# Patient Record
Sex: Female | Born: 1955 | Race: White | Hispanic: No | Smoking: Never smoker
Health system: Southern US, Community
[De-identification: ages and names within clinical notes are randomized; demographics above are authoritative.]

## PROBLEM LIST (undated history)

## (undated) DIAGNOSIS — E669 Obesity, unspecified: Secondary | ICD-10-CM

## (undated) DIAGNOSIS — M349 Systemic sclerosis, unspecified: Secondary | ICD-10-CM

## (undated) DIAGNOSIS — N63 Unspecified lump in unspecified breast: Secondary | ICD-10-CM

## (undated) DIAGNOSIS — F419 Anxiety disorder, unspecified: Secondary | ICD-10-CM

## (undated) DIAGNOSIS — Z1239 Encounter for other screening for malignant neoplasm of breast: Secondary | ICD-10-CM

## (undated) DIAGNOSIS — Z1211 Encounter for screening for malignant neoplasm of colon: Secondary | ICD-10-CM

## (undated) DIAGNOSIS — K639 Disease of intestine, unspecified: Secondary | ICD-10-CM

## (undated) DIAGNOSIS — N6019 Diffuse cystic mastopathy of unspecified breast: Secondary | ICD-10-CM

## (undated) DIAGNOSIS — R928 Other abnormal and inconclusive findings on diagnostic imaging of breast: Secondary | ICD-10-CM

## (undated) HISTORY — DX: Unspecified lump in unspecified breast: N63.0

## (undated) HISTORY — DX: Diffuse cystic mastopathy of unspecified breast: N60.19

## (undated) HISTORY — DX: Encounter for screening for malignant neoplasm of colon: Z12.11

## (undated) HISTORY — DX: Disease of intestine, unspecified: K63.9

## (undated) HISTORY — DX: Systemic sclerosis, unspecified: M34.9

## (undated) HISTORY — DX: Obesity, unspecified: E66.9

## (undated) HISTORY — DX: Encounter for other screening for malignant neoplasm of breast: Z12.39

## (undated) HISTORY — PX: DILATION AND CURETTAGE OF UTERUS: SHX78

## (undated) HISTORY — DX: Other abnormal and inconclusive findings on diagnostic imaging of breast: R92.8

## (undated) HISTORY — DX: Anxiety disorder, unspecified: F41.9

---

## 1977-04-03 HISTORY — PX: HEMORRHOID SURGERY: SHX153

## 2002-04-03 DIAGNOSIS — M349 Systemic sclerosis, unspecified: Secondary | ICD-10-CM

## 2002-04-03 HISTORY — DX: Systemic sclerosis, unspecified: M34.9

## 2004-02-10 ENCOUNTER — Emergency Department: Payer: Self-pay | Admitting: Unknown Physician Specialty

## 2008-04-03 DIAGNOSIS — K639 Disease of intestine, unspecified: Secondary | ICD-10-CM

## 2008-04-03 HISTORY — DX: Disease of intestine, unspecified: K63.9

## 2008-06-19 ENCOUNTER — Ambulatory Visit: Payer: Self-pay | Admitting: Internal Medicine

## 2008-07-01 ENCOUNTER — Ambulatory Visit: Payer: Self-pay | Admitting: Internal Medicine

## 2009-04-03 HISTORY — PX: COLONOSCOPY: SHX174

## 2009-06-17 ENCOUNTER — Ambulatory Visit: Payer: Self-pay | Admitting: General Surgery

## 2010-04-03 DIAGNOSIS — F419 Anxiety disorder, unspecified: Secondary | ICD-10-CM

## 2010-04-03 HISTORY — DX: Anxiety disorder, unspecified: F41.9

## 2010-10-18 ENCOUNTER — Ambulatory Visit: Payer: Self-pay | Admitting: General Surgery

## 2011-04-04 DIAGNOSIS — Z1239 Encounter for other screening for malignant neoplasm of breast: Secondary | ICD-10-CM

## 2011-04-04 DIAGNOSIS — R928 Other abnormal and inconclusive findings on diagnostic imaging of breast: Secondary | ICD-10-CM

## 2011-04-04 DIAGNOSIS — E669 Obesity, unspecified: Secondary | ICD-10-CM

## 2011-04-04 DIAGNOSIS — Z1211 Encounter for screening for malignant neoplasm of colon: Secondary | ICD-10-CM

## 2011-04-04 HISTORY — DX: Other abnormal and inconclusive findings on diagnostic imaging of breast: R92.8

## 2011-04-04 HISTORY — PX: BREAST BIOPSY: SHX20

## 2011-04-04 HISTORY — DX: Obesity, unspecified: E66.9

## 2011-04-04 HISTORY — DX: Encounter for other screening for malignant neoplasm of breast: Z12.39

## 2011-04-04 HISTORY — DX: Encounter for screening for malignant neoplasm of colon: Z12.11

## 2011-08-02 ENCOUNTER — Ambulatory Visit: Payer: Self-pay | Admitting: Internal Medicine

## 2011-12-12 ENCOUNTER — Ambulatory Visit: Payer: Self-pay | Admitting: Internal Medicine

## 2011-12-20 ENCOUNTER — Ambulatory Visit: Payer: Self-pay | Admitting: Internal Medicine

## 2012-04-03 DIAGNOSIS — N6019 Diffuse cystic mastopathy of unspecified breast: Secondary | ICD-10-CM

## 2012-04-03 DIAGNOSIS — N63 Unspecified lump in unspecified breast: Secondary | ICD-10-CM

## 2012-04-03 HISTORY — PX: BREAST EXCISIONAL BIOPSY: SUR124

## 2012-04-03 HISTORY — DX: Unspecified lump in unspecified breast: N63.0

## 2012-04-03 HISTORY — DX: Diffuse cystic mastopathy of unspecified breast: N60.19

## 2012-05-21 ENCOUNTER — Ambulatory Visit: Payer: Self-pay | Admitting: General Surgery

## 2012-05-28 ENCOUNTER — Ambulatory Visit: Payer: Self-pay | Admitting: General Surgery

## 2012-06-26 ENCOUNTER — Encounter: Payer: Self-pay | Admitting: *Deleted

## 2012-06-26 DIAGNOSIS — R928 Other abnormal and inconclusive findings on diagnostic imaging of breast: Secondary | ICD-10-CM | POA: Insufficient documentation

## 2012-07-01 ENCOUNTER — Ambulatory Visit (INDEPENDENT_AMBULATORY_CARE_PROVIDER_SITE_OTHER): Payer: Medicare Other | Admitting: General Surgery

## 2012-07-01 ENCOUNTER — Encounter: Payer: Self-pay | Admitting: General Surgery

## 2012-07-01 ENCOUNTER — Other Ambulatory Visit: Payer: Self-pay

## 2012-07-01 VITALS — BP 128/72 | HR 82 | Resp 14 | Ht 61.0 in | Wt 223.0 lb

## 2012-07-01 DIAGNOSIS — N63 Unspecified lump in unspecified breast: Secondary | ICD-10-CM

## 2012-07-01 NOTE — Patient Instructions (Addendum)
CARE AFTER BREAST BIOPSY  1. Leave the dressing on that your doctor applied after surgery. It is waterproof. You may bathe, shower and/or swim. The dressing will probably remain intact until your return office visit. If the dressing comes off, you will see small strips of tape against your skin on the incision. Do not remove these strips.  2. You may want to use a gauze,cloth or similar protection in your bra to prevent rubbing against your dressing and incision. This is not necessary, but you may feel more comfortable doing so.  3. It is recommended that you wear a bra day and night to give support to the breast. This will prevent the weight of the breast from pulling on the incision.  4. Your breast will feel hard and lumpy under the incision. Do not be alarmed. This is the underlying stitching of tissue. Softening of this tissue will occur in time.  5. Make sure you call the office and schedule an appointment in one week after your surgery. The office phone number is [phone]. The [facility] may have already done this for you.  6. You will notice about a week after your office visit that the strips of the tape on your incision will begin to loosen. These may then be removed.  7. Report to your doctor any of the following:  * Severe pain not relieved by your pain medication  *Redness of the incision  * Drainage from the incision  *Fever greater than 101 degrees  Patient to have a unilateral left breast diagnostic mammogram at [facility] on 07-15-12 at 1:30 pm. She is aware of date, time, and instructions.

## 2012-07-01 NOTE — Progress Notes (Signed)
Patient ID: Christina Bradford, female   DOB: 1956/03/16, 57 y.o.   MRN: [id number]  No chief complaint on file.   HPI Christina Bradford is a 57 y.o. female here today for a left breast core biopsy . HPI  Past Medical History  Diagnosis Date  . Anxiety 2012  . Diffuse cystic mastopathy 2014  . Abnormal mammogram, unspecified 2013  . Scleroderma 2004  . Breast screening, unspecified 2013  . Lump or mass in breast 2014  . Special screening for malignant neoplasms, colon 2013  . Obesity, unspecified 2013  . Bowel trouble 2010    Past Surgical History  Procedure Laterality Date  . Hemorrhoid surgery  1979  . Colonoscopy  2011    [facility]  . Dilation and curettage of uterus      many years ago  . Breast biopsy Right 2011    stereotactic    History reviewed. No pertinent family history.  Social History History  Substance Use Topics  . Smoking status: Never Smoker   . Smokeless tobacco: Never Used  . Alcohol Use: No    No Known Allergies  Current Outpatient Prescriptions  Medication Sig Dispense Refill  . albuterol (PROAIR HFA) 108 (90 BASE) MCG/ACT inhaler Inhale 1 puff into the lungs every 6 (six) hours as needed for wheezing.      . bosentan (TRACLEER) 62.5 MG tablet Take 62.5 mg by mouth 2 (two) times daily.      . citalopram (CELEXA) 40 MG tablet Take 40 mg by mouth daily.      Marland Kitchen esomeprazole (NEXIUM) 40 MG capsule Take 40 mg by mouth 2 (two) times daily.      . fluticasone (FLOVENT HFA) 220 MCG/ACT inhaler Inhale 2 puffs into the lungs 2 (two) times daily.      Marland Kitchen levothyroxine (SYNTHROID, LEVOTHROID) 112 MCG tablet Take 112 mcg by mouth daily.       No current facility-administered medications for this visit.    Review of Systems Review of Systems  Constitutional: Negative.   Respiratory: Negative.   Cardiovascular: Negative.     Blood pressure 128/72, pulse 82, resp. rate 14, height 5\' 1"  (1.549 m), weight 223 lb (101.152 kg).  Physical Exam Physical Exam   Pulmonary/Chest: Left breast exhibits no inverted nipple, no mass, no nipple discharge, no skin change and no tenderness.    Procedure-core biopsy left 5 o'cl completed.   Data Reviewed    Assessment    Left breast mass.     Plan    Await path report.         Gunter Conde G 07/04/2012, 2:11 PM

## 2012-07-02 ENCOUNTER — Encounter: Payer: Self-pay | Admitting: General Surgery

## 2012-07-08 ENCOUNTER — Telehealth: Payer: Self-pay | Admitting: *Deleted

## 2012-07-08 NOTE — Telephone Encounter (Signed)
Talked with pt by phone today. Final path is pending.

## 2012-07-08 NOTE — Telephone Encounter (Signed)
Patient called would like to know the results from biopsy done last week 07/01/12. Patient has not heard anything as of yet. Thank you

## 2012-07-10 ENCOUNTER — Telehealth: Payer: Self-pay | Admitting: *Deleted

## 2012-07-10 LAB — PATHOLOGY

## 2012-07-10 NOTE — Telephone Encounter (Signed)
Message copied by Currie Paris on Wed Jul 10, 2012  4:32 PM ------      Message from: Kieth Brightly      Created: Wed Jul 10, 2012  3:50 PM      Regarding: Path report       Please inform Ms Vasey- final path of left breast coe biopsy-benign lymph node. This was consult obtained form [facility].      She is scheduled for left mammogram soon and will see her after that.  ------

## 2012-07-10 NOTE — Telephone Encounter (Signed)
Notified patient as instructed, patient pleased. Discussed follow-up appointments, patient agrees  

## 2012-07-11 ENCOUNTER — Encounter: Payer: Self-pay | Admitting: General Surgery

## 2012-07-15 ENCOUNTER — Ambulatory Visit: Payer: Self-pay | Admitting: General Surgery

## 2012-07-22 ENCOUNTER — Encounter: Payer: Self-pay | Admitting: *Deleted

## 2012-07-23 ENCOUNTER — Ambulatory Visit (INDEPENDENT_AMBULATORY_CARE_PROVIDER_SITE_OTHER): Payer: Medicare Other | Admitting: General Surgery

## 2012-07-23 ENCOUNTER — Encounter: Payer: Self-pay | Admitting: General Surgery

## 2012-07-23 VITALS — BP 132/74 | HR 78 | Resp 14 | Ht 61.0 in | Wt 226.0 lb

## 2012-07-23 DIAGNOSIS — N63 Unspecified lump in unspecified breast: Secondary | ICD-10-CM

## 2012-07-23 DIAGNOSIS — D47Z9 Other specified neoplasms of uncertain behavior of lymphoid, hematopoietic and related tissue: Secondary | ICD-10-CM | POA: Insufficient documentation

## 2012-07-23 NOTE — Patient Instructions (Addendum)
As recommonded in left breast excision of both masses to be scheduled.  Patient's surgery has been scheduled for 08-09-12 at [facility].   This patient was sent to [facility] lab to have the following drawn: CBC.

## 2012-07-23 NOTE — Progress Notes (Signed)
Patient ID: Christina Bradford, female   DOB: 1955-11-21, 57 y.o.   MRN: [id number]  Chief Complaint  Patient presents with  . Follow-up    mammogram     HPI Christina Bradford is a 57 y.o. female here today for here follow up mammogram done on 07/15/12 cat 4. Patient states she feels no lumps and is not having any pain. Patient had a left breast core biopsy done 07/01/12 for a mass at 5 o'cl. Prior she had core biopsy of a mass at 4 o'cl location. Both areas showed lymphoid tissue with some atypical cells.  HPI  Past Medical History  Diagnosis Date  . Anxiety 2012  . Diffuse cystic mastopathy 2014  . Abnormal mammogram, unspecified 2013  . Scleroderma 2004  . Breast screening, unspecified 2013  . Lump or mass in breast 2014  . Special screening for malignant neoplasms, colon 2013  . Obesity, unspecified 2013  . Bowel trouble 2010    Past Surgical History  Procedure Laterality Date  . Hemorrhoid surgery  1979  . Colonoscopy  2011    [facility]  . Dilation and curettage of uterus      many years ago  . Breast biopsy Right 2011    stereotactic    History reviewed. No pertinent family history.  Social History History  Substance Use Topics  . Smoking status: Never Smoker   . Smokeless tobacco: Never Used  . Alcohol Use: No    No Known Allergies  Current Outpatient Prescriptions  Medication Sig Dispense Refill  . albuterol (PROAIR HFA) 108 (90 BASE) MCG/ACT inhaler Inhale 1 puff into the lungs every 6 (six) hours as needed for wheezing.      . bosentan (TRACLEER) 62.5 MG tablet Take 62.5 mg by mouth 2 (two) times daily.      . citalopram (CELEXA) 40 MG tablet Take 40 mg by mouth daily.      Marland Kitchen esomeprazole (NEXIUM) 40 MG capsule Take 40 mg by mouth 2 (two) times daily.      . fluticasone (FLOVENT HFA) 220 MCG/ACT inhaler Inhale 2 puffs into the lungs 2 (two) times daily.      Marland Kitchen levothyroxine (SYNTHROID, LEVOTHROID) 112 MCG tablet Take 112 mcg by mouth daily.       No current  facility-administered medications for this visit.    Review of Systems Review of Systems  Constitutional: Negative.   Respiratory: Negative.   Cardiovascular: Negative.     Blood pressure 132/74, pulse 78, resp. rate 14, height 5\' 1"  (1.549 m), weight 226 lb (102.513 kg).  Physical Exam Physical Exam  Constitutional: She appears well-developed and well-nourished.  Neck: Normal range of motion. Neck supple.  Cardiovascular: Normal rate, regular rhythm and normal heart sounds.   Pulmonary/Chest: Effort normal and breath sounds normal. Right breast exhibits no inverted nipple, no mass, no nipple discharge, no skin change and no tenderness. Left breast exhibits no inverted nipple, no mass, no nipple discharge, no skin change and no tenderness.  Lymphadenopathy:    She has no cervical adenopathy.    She has no axillary adenopathy.    Data Reviewed Pathology reviewed.  Assessment    Atypical lymphoid masses in left breast     Plan    Explained the finding and recommended excision of both masses in left breast. Pt is agreeable.     Patient's surgery has been scheduled for 08-09-12 at [facility].   This patient was sent to [facility] lab to have the following drawn: CBC.  Kem Hensen G 07/23/2012, 3:14 PM

## 2012-07-24 ENCOUNTER — Other Ambulatory Visit: Payer: Self-pay | Admitting: General Surgery

## 2012-07-24 DIAGNOSIS — N6320 Unspecified lump in the left breast, unspecified quadrant: Secondary | ICD-10-CM

## 2012-07-24 LAB — CBC WITH DIFFERENTIAL/PLATELET
Basos: 1 % (ref 0–3)
Eos: 3 % (ref 0–5)
Immature Grans (Abs): 0 10*3/uL (ref 0.0–0.1)
Lymphs: 20 % (ref 14–46)
Monocytes: 9 % (ref 4–12)
Neutrophils Relative %: 67 % (ref 40–74)
RBC: 4.15 x10E6/uL (ref 3.77–5.28)
WBC: 7.8 10*3/uL (ref 3.4–10.8)

## 2012-08-09 ENCOUNTER — Ambulatory Visit: Payer: Self-pay | Admitting: General Surgery

## 2012-08-09 DIAGNOSIS — D249 Benign neoplasm of unspecified breast: Secondary | ICD-10-CM

## 2012-08-09 HISTORY — PX: BREAST MASS EXCISION: SHX1267

## 2012-08-12 ENCOUNTER — Encounter: Payer: Self-pay | Admitting: General Surgery

## 2012-08-14 ENCOUNTER — Encounter: Payer: Self-pay | Admitting: General Surgery

## 2012-08-20 ENCOUNTER — Encounter: Payer: Self-pay | Admitting: General Surgery

## 2012-08-20 ENCOUNTER — Ambulatory Visit (INDEPENDENT_AMBULATORY_CARE_PROVIDER_SITE_OTHER): Payer: Medicare Other | Admitting: General Surgery

## 2012-08-20 VITALS — BP 140/80 | HR 80 | Resp 18 | Ht 61.0 in | Wt 226.0 lb

## 2012-08-20 DIAGNOSIS — N6320 Unspecified lump in the left breast, unspecified quadrant: Secondary | ICD-10-CM

## 2012-08-20 DIAGNOSIS — N63 Unspecified lump in unspecified breast: Secondary | ICD-10-CM

## 2012-08-20 NOTE — Patient Instructions (Addendum)
Continue self breast exams. Call office for any new breast issues or concerns. We will call once we get the final pathology report.

## 2012-08-20 NOTE — Progress Notes (Signed)
Patient ID: Christina Bradford, female   DOB: 1955/08/27, 57 y.o.   MRN: [id number]  Chief Complaint  Patient presents with  . Routine Post Op    excision breast mass    HPI Christina Bradford is a 57 y.o. female.  Patient here today for postop excision left breast mass x 2 done 08-09-12.  States she is "doing good".  HPI  Past Medical History  Diagnosis Date  . Anxiety 2012  . Diffuse cystic mastopathy 2014  . Abnormal mammogram, unspecified 2013  . Scleroderma 2004  . Breast screening, unspecified 2013  . Lump or mass in breast 2014  . Special screening for malignant neoplasms, colon 2013  . Obesity, unspecified 2013  . Bowel trouble 2010    Past Surgical History  Procedure Laterality Date  . Hemorrhoid surgery  1979  . Colonoscopy  2011    [facility]  . Dilation and curettage of uterus      many years ago  . Breast mass excision Left 08-09-12    History reviewed. No pertinent family history.  Social History History  Substance Use Topics  . Smoking status: Never Smoker   . Smokeless tobacco: Never Used  . Alcohol Use: No    No Known Allergies  Current Outpatient Prescriptions  Medication Sig Dispense Refill  . albuterol (PROAIR HFA) 108 (90 BASE) MCG/ACT inhaler Inhale 1 puff into the lungs every 6 (six) hours as needed for wheezing.      . bosentan (TRACLEER) 62.5 MG tablet Take 62.5 mg by mouth 2 (two) times daily.      . citalopram (CELEXA) 40 MG tablet Take 40 mg by mouth daily.      Marland Kitchen esomeprazole (NEXIUM) 40 MG capsule Take 40 mg by mouth 2 (two) times daily.      . fluticasone (FLOVENT HFA) 220 MCG/ACT inhaler Inhale 2 puffs into the lungs 2 (two) times daily.      Marland Kitchen levothyroxine (SYNTHROID, LEVOTHROID) 112 MCG tablet Take 112 mcg by mouth daily.       No current facility-administered medications for this visit.    Review of Systems Review of Systems  Constitutional: Negative.   Respiratory: Negative.   Cardiovascular: Negative.     Blood pressure 140/80, pulse  80, resp. rate 18, height 5\' 1"  (1.549 m), weight 226 lb (102.513 kg).  Physical Exam Physical Exam  Constitutional: She is oriented to person, place, and time. She appears well-developed and well-nourished.  Neurological: She is alert and oriented to person, place, and time.  Skin: Skin is warm and dry.   Incisions are clean and healing well lower outer aspect left breast  Data Reviewed Pathology pending consultation from [facility].  There is concern for neoplastic process with an unusual pattern of flow on the 2 lymphoid masses removed. Assessment    Doing well postop    Plan    Based on final pathology report will decide on further f/u and or treatment       SANKAR,SEEPLAPUTHUR G 08/21/2012, 12:36 PM

## 2012-08-21 ENCOUNTER — Encounter: Payer: Self-pay | Admitting: General Surgery

## 2012-08-27 ENCOUNTER — Encounter: Payer: Self-pay | Admitting: General Surgery

## 2012-08-30 ENCOUNTER — Telehealth: Payer: Self-pay | Admitting: *Deleted

## 2012-08-30 ENCOUNTER — Telehealth: Payer: Self-pay | Admitting: General Surgery

## 2012-08-30 DIAGNOSIS — D4862 Neoplasm of uncertain behavior of left breast: Secondary | ICD-10-CM

## 2012-08-30 NOTE — Telephone Encounter (Signed)
Final path on the left breast masses excised- suspicious for a T cell lymphoma, however the report from [facility] favoured a more reactive process.  Her case was discussed at Tumor case conference yesterday. Consensus feeling this may in fact more manifest as a lymphoma. Recommendation was made for PET scan and possible dedicated Abd Ct with contrast.  Pt was fully advised on this. She is agreeable to further imaging as above.

## 2012-08-30 NOTE — Telephone Encounter (Signed)
Patient has been scheduled for a PET scan at [facility] for 09-05-12 at 12:30 pm (arrive 12:15 pm). This patient is to check-in at the [facility]. She is aware of all prep instructions.   Please note: patient states last menstrual period was in her 54's.

## 2012-09-05 ENCOUNTER — Ambulatory Visit: Payer: Self-pay | Admitting: General Surgery

## 2012-09-10 ENCOUNTER — Telehealth: Payer: Self-pay | Admitting: *Deleted

## 2012-09-10 ENCOUNTER — Ambulatory Visit: Payer: Self-pay | Admitting: Oncology

## 2012-09-10 DIAGNOSIS — N63 Unspecified lump in unspecified breast: Secondary | ICD-10-CM

## 2012-09-10 NOTE — Telephone Encounter (Signed)
Patient has been scheduled for an appointment with Dr. Doylene Canning at the [facility] for 09-16-12 at 2 pm. She is aware of date, time, and instructions.

## 2012-09-10 NOTE — Telephone Encounter (Signed)
Message copied by Nicholes Mango on Tue Sep 10, 2012 10:06 AM ------      Message from: Kieth Brightly      Created: Tue Sep 10, 2012  9:15 AM       Please make appt for pt to see me.  ------

## 2012-09-10 NOTE — Telephone Encounter (Signed)
Patient has been scheduled for an appointment on 09-17-12. She is aware of instructions.

## 2012-09-16 ENCOUNTER — Ambulatory Visit: Payer: Self-pay | Admitting: Oncology

## 2012-09-16 LAB — CBC CANCER CENTER
Basophil %: 1.1 %
Eosinophil #: 0.3 x10 3/mm (ref 0.0–0.7)
HGB: 11.2 g/dL — ABNORMAL LOW (ref 12.0–16.0)
Lymphocyte #: 1.5 x10 3/mm (ref 1.0–3.6)
MCH: 26.7 pg (ref 26.0–34.0)
MCV: 81 fL (ref 80–100)
Monocyte #: 0.8 x10 3/mm (ref 0.2–0.9)
Neutrophil #: 5.9 x10 3/mm (ref 1.4–6.5)
Platelet: 296 x10 3/mm (ref 150–440)
RBC: 4.17 10*6/uL (ref 3.80–5.20)
RDW: 15.3 % — ABNORMAL HIGH (ref 11.5–14.5)
WBC: 8.6 x10 3/mm (ref 3.6–11.0)

## 2012-09-16 LAB — COMPREHENSIVE METABOLIC PANEL WITH GFR
Albumin: 3.4 g/dL
Alkaline Phosphatase: 140 U/L — ABNORMAL HIGH
Anion Gap: 6 — ABNORMAL LOW
BUN: 13 mg/dL
Bilirubin,Total: 0.2 mg/dL
Calcium, Total: 9 mg/dL
Chloride: 100 mmol/L
Co2: 29 mmol/L
Creatinine: 1.05 mg/dL
EGFR (African American): >60
EGFR (Non-African Amer.): 58 — ABNORMAL LOW
Glucose: 89 mg/dL
Osmolality: 270
Potassium: 4 mmol/L
SGOT(AST): 19 U/L
SGPT (ALT): 29 U/L
Sodium: 135 mmol/L — ABNORMAL LOW
Total Protein: 8.1 g/dL

## 2012-09-17 ENCOUNTER — Ambulatory Visit (INDEPENDENT_AMBULATORY_CARE_PROVIDER_SITE_OTHER): Payer: Medicare Other | Admitting: General Surgery

## 2012-09-17 ENCOUNTER — Encounter: Payer: Self-pay | Admitting: General Surgery

## 2012-09-17 VITALS — BP 112/64 | HR 86 | Resp 14 | Ht 61.0 in | Wt 225.0 lb

## 2012-09-17 DIAGNOSIS — D487 Neoplasm of uncertain behavior of other specified sites: Secondary | ICD-10-CM

## 2012-09-17 DIAGNOSIS — R222 Localized swelling, mass and lump, trunk: Secondary | ICD-10-CM

## 2012-09-17 DIAGNOSIS — N63 Unspecified lump in unspecified breast: Secondary | ICD-10-CM

## 2012-09-17 NOTE — Progress Notes (Signed)
Patient ID: Christina Bradford, female   DOB: 1955-08-19, 57 y.o.   MRN: [id number]  Chief Complaint  Patient presents with  . Other    pet scan follow up    HPI Christina Bradford is a 57 y.o. female here today following up from an PET scan , Patient had an excision left breast mass x 2 done 08-09-12. Patient states she is doing well. As noted before PET scan was done to seen if any masses or nodes show hypermetabolic activity. The scan showed a large mass oner lower end of sternum and a left UQ abd wall uptake.   HPI  Past Medical History  Diagnosis Date  . Anxiety 2012  . Diffuse cystic mastopathy 2014  . Abnormal mammogram, unspecified 2013  . Scleroderma 2004  . Breast screening, unspecified 2013  . Lump or mass in breast 2014  . Special screening for malignant neoplasms, colon 2013  . Obesity, unspecified 2013  . Bowel trouble 2010    Past Surgical History  Procedure Laterality Date  . Hemorrhoid surgery  1979  . Colonoscopy  2011    [facility]  . Dilation and curettage of uterus      many years ago  . Breast mass excision Left 08-09-12    History reviewed. No pertinent family history.  Social History History  Substance Use Topics  . Smoking status: Never Smoker   . Smokeless tobacco: Never Used  . Alcohol Use: No    No Known Allergies  Current Outpatient Prescriptions  Medication Sig Dispense Refill  . albuterol (PROAIR HFA) 108 (90 BASE) MCG/ACT inhaler Inhale 1 puff into the lungs every 6 (six) hours as needed for wheezing.      . bosentan (TRACLEER) 62.5 MG tablet Take 62.5 mg by mouth 2 (two) times daily.      Marland Kitchen esomeprazole (NEXIUM) 40 MG capsule Take 40 mg by mouth 2 (two) times daily.      . fluticasone (FLOVENT HFA) 220 MCG/ACT inhaler Inhale 2 puffs into the lungs 2 (two) times daily.      Marland Kitchen levothyroxine (SYNTHROID, LEVOTHROID) 112 MCG tablet Take 112 mcg by mouth daily.       No current facility-administered medications for this visit.    Review of  Systems Review of Systems  Constitutional: Negative.   Respiratory: Negative.   Cardiovascular: Negative.     Blood pressure 112/64, pulse 86, resp. rate 14, height 5\' 1"  (1.549 m), weight 225 lb (102.059 kg).  Physical Exam Physical Exam Over the lower sternal area a deep seated firm mass was palpated. There is cutaneous 2 cm cyst in left UQ of abd wall.  Data Reviewed PET scan  Assessment    Suspicious for lymphoma. As recommended by  Oncology, core biopsy of soft tissue mass over sternal area was completed today.  US of the mass over sternal area showed a deep seated 3.04 cm long 2.2cm deep hypoechoic mass. The mass in LUQ abd wall is consistent with a sebaceous cyst.  After prep with chloroprep over the sternal mass, 6 ml of 1% xylocaine mixed with 0.5 % marcaine was instilled. Skin opening made. The Bard core biopsy device was used with US guidance to obtain 5 cores of tissue.  Gauze sponge,  tegaderm placed. No immediate problems from procedure.  Plan      Await path report.       SANKAR,SEEPLAPUTHUR G 09/18/2012, 3:54 PM

## 2012-09-18 ENCOUNTER — Encounter: Payer: Self-pay | Admitting: General Surgery

## 2012-09-18 DIAGNOSIS — R222 Localized swelling, mass and lump, trunk: Secondary | ICD-10-CM | POA: Insufficient documentation

## 2012-09-18 DIAGNOSIS — D487 Neoplasm of uncertain behavior of other specified sites: Secondary | ICD-10-CM | POA: Insufficient documentation

## 2012-09-18 NOTE — Patient Instructions (Signed)
Further plan based on path report

## 2012-10-01 ENCOUNTER — Telehealth: Payer: Self-pay | Admitting: *Deleted

## 2012-10-01 ENCOUNTER — Ambulatory Visit: Payer: Self-pay | Admitting: Oncology

## 2012-10-01 NOTE — Telephone Encounter (Signed)
Notified pt that Christina Bradford was still processing reports per Dr Evette Cristal.

## 2012-10-17 LAB — PATHOLOGY

## 2012-11-01 ENCOUNTER — Encounter: Payer: Self-pay | Admitting: General Surgery

## 2012-11-01 ENCOUNTER — Ambulatory Visit: Payer: Self-pay | Admitting: Oncology

## 2013-06-10 IMAGING — PT NM PET TUM IMG RESTAG (PS) SKULL BASE T - THIGH
1 of 6 series · 2 of 25 positions shown · non-contrast
Comparison: none

REASON FOR EXAM: Lymphoid tissue left breast uncertain behavior
COMMENTS:

[Series 3: ct wb 3.0 b30f · axial · 3.0mm · 0.98mm/px · z∈[-586,-476]mm · 2 of 435 slices shown]
[im 380/435  brain]
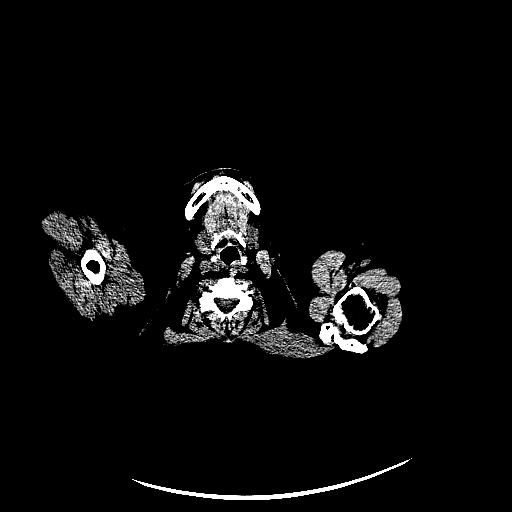
[im 435/435  brain]
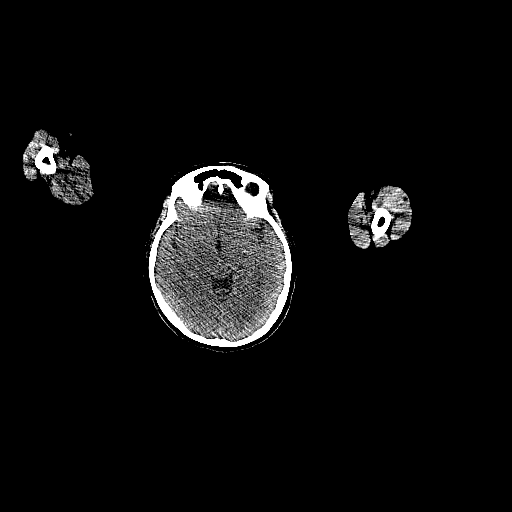

[2 of 25 positions shown; findings below may reference images not displayed]

PROCEDURE:     PET - PET/CT BREAST CA DX  - September 05, 2012  [DATE]

RESULT:

Procedure:

Radiopharmaceutical: 11.69 mCi of F-18 labeled fluorodeoxyglucose

Injection site: Left antecubital utilizing a 22-gauge needle

Fasting blood glucose: 57 mg/dL

Injection time: [DATE] p.m.

Scan start time: [DATE] p.m.

Scan end time: [DATE] p.m.
FINDINGS: Expected biodistribution is appreciated in the region of the base of the
brain, heart, liver, spleen, kidneys, urinary bladder and portions of the
bowel.

Chest: There is diffuse hypermetabolic activity within a soft tissue
appearing nodule along the central chest wall just anterior to the lower
sternum. This correlates with a round soft tissue mass and demonstrates an
SUV of 4.18. No further regions of abnormal hypermetabolic activity are
identified within the chest.

Abdomen: An area of intermediate FDG avidity is appreciated within a
subcutaneous nodule along the anterior aspect of the upper abdomen on the
left. This demonstrates an SUV of 1.58.

An area of intermediate FDG avidity is appreciated along the greater
trochanteric bursal region of the right femur demonstrating an SUV of 1.8.
No further regions of abnormal FDG avidity are appreciated within the
abdomen or pelvis.
IMPRESSION: 1. FDG avid soft tissue appearing mass just anterior to the lower sternum.
2. Indeterminate nodule within the subcutaneous regions of the upper abdomen
on the left. This may represent a sebaceous cyst or may represent a second
focus of the tissue abnormality along the sternum.
3. Findings likely representing bursitis involving the right hip. More
ominous etiologies cannot be completely excluded.

## 2014-02-02 ENCOUNTER — Encounter: Payer: Self-pay | Admitting: General Surgery

## 2014-07-17 ENCOUNTER — Other Ambulatory Visit: Payer: Self-pay | Admitting: Internal Medicine

## 2014-07-17 DIAGNOSIS — Z139 Encounter for screening, unspecified: Secondary | ICD-10-CM

## 2014-07-24 NOTE — Op Note (Signed)
PATIENT NAME:  Christina Bradford, Christina Bradford MR#:  [id number] DATE OF BIRTH:  03-11-1956  DATE OF PROCEDURE:  08/09/2012  PREOPERATIVE DIAGNOSIS: Left breast masses, x 2 with atypical features.   POSTOPERATIVE DIAGNOSIS:     OPERATION:  1.  Excision left breast mass at 4 o'clock and left breast mass at 5 o'clock. 2.  Ultrasound guidance and wire localization.   SURGEON: Mckinley Jewel, M.D.   ANESTHESIA: General.   COMPLICATIONS: None.   ESTIMATED BLOOD LOSS: Minimal.   DRAINS: None.   DESCRIPTION OF PROCEDURE: The patient was put to sleep in the supine position on the operating table. The left breast was prepped and draped out as a sterile field. A time out was performed. Ultrasound probe with a sterile cover was brought onto the field.  The lesion at 4:00 o'clock was identified in the periphery of the breast.  This was an irregularly-shaped hypoechoic mass. There had no resemblance to an actual lymph node, although the previous biopsies had shown lymph tissue with some atypical changes. The area was infiltrated with some 0.5% Marcaine.  A small skin stab incision was made and with ultrasound guidance, a Bard ultra wire was positioned to localize the lesion. The ultrasound was used to identify the similar irregularly-shaped mass in the 5:00 location in the periphery of the breast and again after instillation of local anesthetic, a tiny stab incision made and an ultra wire placed to anchor this also.  Following this, incisions were made along the  4 and 5 o'clock locations from the entrance of the wire site.  the 5 o'clock one was removed first.  The wire was used as a guide to excise out as the old tissue surrounding this which included the mass in question.  There was noted to be a palpable 2 cm hard mass. Ultrasound of the excised specimen showed the presence of a nodule within this.  This was sent fresh to pathology. In a similar fashion, the lesion at 4 o'clock was excised out. This was more superficially  located. It was slightly smaller, about 1.5 cm and had the same firm features.   The bleeding was controlled with cautery. The deeper planes were closed with 2-0 Vicryl and the skin closed with subcuticular 4-0 Vicryl, covered with Dermabond. The procedure was well tolerated. No immediate problems encountered. She was then returned to the recovery room in stable condition.    ____________________________ S.Robinette Haines, MD sgs:ct D: 08/09/2012 16:55:48 ET T: 08/10/2012 12:57:47 ET JOB#: [id number]  cc: Synthia Innocent. Jamal Collin, MD, <Dictator> [facility] Robinette Haines MD ELECTRONICALLY SIGNED 08/12/2012 6:33

## 2014-08-19 ENCOUNTER — Other Ambulatory Visit: Payer: Self-pay | Admitting: Internal Medicine

## 2014-08-19 ENCOUNTER — Ambulatory Visit
Admission: RE | Admit: 2014-08-19 | Discharge: 2014-08-19 | Disposition: A | Discharge status: Home / Self Care | Payer: Medicare Other | Source: Ambulatory Visit | Attending: Internal Medicine | Admitting: Internal Medicine

## 2014-08-19 DIAGNOSIS — Z139 Encounter for screening, unspecified: Secondary | ICD-10-CM

## 2014-08-19 DIAGNOSIS — Z1231 Encounter for screening mammogram for malignant neoplasm of breast: Secondary | ICD-10-CM

## 2015-09-21 ENCOUNTER — Other Ambulatory Visit: Payer: Self-pay | Admitting: Internal Medicine

## 2015-09-21 DIAGNOSIS — Z1231 Encounter for screening mammogram for malignant neoplasm of breast: Secondary | ICD-10-CM

## 2015-09-28 ENCOUNTER — Ambulatory Visit: Payer: Medicare Other

## 2015-10-27 ENCOUNTER — Other Ambulatory Visit: Payer: Self-pay | Admitting: Internal Medicine

## 2015-10-27 ENCOUNTER — Ambulatory Visit
Admission: RE | Admit: 2015-10-27 | Discharge: 2015-10-27 | Disposition: A | Discharge status: Home / Self Care | Payer: Medicare Other | Source: Ambulatory Visit | Attending: Internal Medicine | Admitting: Internal Medicine

## 2015-10-27 DIAGNOSIS — Z1231 Encounter for screening mammogram for malignant neoplasm of breast: Secondary | ICD-10-CM | POA: Diagnosis not present

## 2016-11-09 ENCOUNTER — Other Ambulatory Visit: Payer: Self-pay | Admitting: Internal Medicine

## 2016-11-09 DIAGNOSIS — Z1231 Encounter for screening mammogram for malignant neoplasm of breast: Secondary | ICD-10-CM

## 2016-11-22 ENCOUNTER — Ambulatory Visit
Admission: RE | Admit: 2016-11-22 | Discharge: 2016-11-22 | Disposition: A | Discharge status: Home / Self Care | Payer: Medicare Other | Source: Ambulatory Visit | Attending: Internal Medicine | Admitting: Internal Medicine

## 2016-11-22 DIAGNOSIS — Z1231 Encounter for screening mammogram for malignant neoplasm of breast: Secondary | ICD-10-CM | POA: Diagnosis not present

## 2016-11-22 DIAGNOSIS — R928 Other abnormal and inconclusive findings on diagnostic imaging of breast: Secondary | ICD-10-CM | POA: Diagnosis not present

## 2016-12-08 ENCOUNTER — Other Ambulatory Visit: Payer: Self-pay | Admitting: Internal Medicine

## 2016-12-08 DIAGNOSIS — R2231 Localized swelling, mass and lump, right upper limb: Secondary | ICD-10-CM

## 2016-12-08 DIAGNOSIS — R928 Other abnormal and inconclusive findings on diagnostic imaging of breast: Secondary | ICD-10-CM

## 2016-12-14 ENCOUNTER — Ambulatory Visit: Payer: Medicare Other | Attending: Internal Medicine

## 2016-12-25 ENCOUNTER — Other Ambulatory Visit: Payer: Self-pay | Admitting: Internal Medicine

## 2016-12-25 ENCOUNTER — Ambulatory Visit
Admission: RE | Admit: 2016-12-25 | Discharge: 2016-12-25 | Disposition: A | Discharge status: Home / Self Care | Payer: Medicare Other | Source: Ambulatory Visit | Attending: Internal Medicine | Admitting: Internal Medicine

## 2016-12-25 DIAGNOSIS — R928 Other abnormal and inconclusive findings on diagnostic imaging of breast: Secondary | ICD-10-CM

## 2016-12-25 DIAGNOSIS — R2231 Localized swelling, mass and lump, right upper limb: Secondary | ICD-10-CM

## 2016-12-28 ENCOUNTER — Other Ambulatory Visit: Payer: Self-pay | Admitting: Internal Medicine

## 2016-12-28 DIAGNOSIS — R928 Other abnormal and inconclusive findings on diagnostic imaging of breast: Secondary | ICD-10-CM

## 2017-01-08 ENCOUNTER — Ambulatory Visit: Payer: Medicare Other

## 2017-01-12 ENCOUNTER — Inpatient Hospital Stay: Admission: RE | Admit: 2017-01-12 | Payer: Medicare Other | Source: Ambulatory Visit

## 2017-01-12 ENCOUNTER — Ambulatory Visit: Payer: Medicare Other | Attending: Internal Medicine

## 2017-01-26 ENCOUNTER — Ambulatory Visit
Admission: RE | Admit: 2017-01-26 | Discharge: 2017-01-26 | Disposition: A | Discharge status: Home / Self Care | Payer: Medicare Other | Source: Ambulatory Visit | Attending: Internal Medicine | Admitting: Internal Medicine

## 2017-01-26 DIAGNOSIS — R928 Other abnormal and inconclusive findings on diagnostic imaging of breast: Secondary | ICD-10-CM

## 2017-01-26 DIAGNOSIS — D360 Benign neoplasm of lymph nodes: Secondary | ICD-10-CM | POA: Diagnosis not present

## 2017-01-26 HISTORY — PX: BREAST BIOPSY: SHX20

## 2017-01-29 LAB — SURGICAL PATHOLOGY

## 2017-06-18 ENCOUNTER — Ambulatory Visit
Admission: RE | Admit: 2017-06-18 | Discharge: 2017-06-18 | Disposition: A | Discharge status: Home / Self Care | Payer: Medicare Other | Source: Ambulatory Visit | Attending: Internal Medicine | Admitting: Internal Medicine

## 2017-06-18 ENCOUNTER — Other Ambulatory Visit: Payer: Self-pay | Admitting: Internal Medicine

## 2017-06-18 DIAGNOSIS — J986 Disorders of diaphragm: Secondary | ICD-10-CM | POA: Diagnosis not present

## 2017-06-18 DIAGNOSIS — R05 Cough: Secondary | ICD-10-CM

## 2017-06-18 DIAGNOSIS — M349 Systemic sclerosis, unspecified: Secondary | ICD-10-CM | POA: Insufficient documentation

## 2017-06-18 DIAGNOSIS — I700 Atherosclerosis of aorta: Secondary | ICD-10-CM | POA: Insufficient documentation

## 2017-06-18 DIAGNOSIS — R059 Cough, unspecified: Secondary | ICD-10-CM

## 2018-01-21 ENCOUNTER — Other Ambulatory Visit: Payer: Self-pay | Admitting: Internal Medicine

## 2018-01-21 DIAGNOSIS — Z1231 Encounter for screening mammogram for malignant neoplasm of breast: Secondary | ICD-10-CM

## 2018-02-25 ENCOUNTER — Ambulatory Visit
Admission: RE | Admit: 2018-02-25 | Discharge: 2018-02-25 | Disposition: A | Discharge status: Home / Self Care | Payer: Medicare Other | Source: Ambulatory Visit | Attending: Internal Medicine | Admitting: Internal Medicine

## 2018-02-25 DIAGNOSIS — Z1231 Encounter for screening mammogram for malignant neoplasm of breast: Secondary | ICD-10-CM

## 2019-02-05 ENCOUNTER — Other Ambulatory Visit: Payer: Self-pay | Admitting: Internal Medicine

## 2019-02-05 DIAGNOSIS — Z1231 Encounter for screening mammogram for malignant neoplasm of breast: Secondary | ICD-10-CM

## 2019-10-15 ENCOUNTER — Inpatient Hospital Stay: Admission: RE | Admit: 2019-10-15 | Payer: Medicare Other | Source: Ambulatory Visit

## 2019-10-28 ENCOUNTER — Ambulatory Visit
Admission: RE | Admit: 2019-10-28 | Discharge: 2019-10-28 | Disposition: A | Discharge status: Home / Self Care | Payer: Medicare Other | Source: Ambulatory Visit | Attending: Internal Medicine | Admitting: Internal Medicine

## 2019-10-28 DIAGNOSIS — Z1231 Encounter for screening mammogram for malignant neoplasm of breast: Secondary | ICD-10-CM | POA: Diagnosis not present

## 2020-06-17 ENCOUNTER — Other Ambulatory Visit: Payer: Self-pay | Admitting: Internal Medicine

## 2020-06-17 DIAGNOSIS — R2681 Unsteadiness on feet: Secondary | ICD-10-CM

## 2020-07-01 ENCOUNTER — Other Ambulatory Visit: Payer: Self-pay

## 2020-07-01 ENCOUNTER — Ambulatory Visit
Admission: RE | Admit: 2020-07-01 | Discharge: 2020-07-01 | Disposition: A | Discharge status: Home / Self Care | Payer: Medicare Other | Source: Ambulatory Visit | Attending: Internal Medicine | Admitting: Internal Medicine

## 2020-07-01 DIAGNOSIS — R2681 Unsteadiness on feet: Secondary | ICD-10-CM | POA: Diagnosis present

## 2020-07-01 MED ORDER — GADOBUTROL 1 MMOL/ML IV SOLN
10.0000 mL | Freq: Once | INTRAVENOUS | Status: AC | PRN
  Administered 2020-07-01: 10 mL via INTRAVENOUS

## 2020-10-13 ENCOUNTER — Other Ambulatory Visit: Payer: Self-pay

## 2020-10-13 DIAGNOSIS — Z1231 Encounter for screening mammogram for malignant neoplasm of breast: Secondary | ICD-10-CM

## 2020-10-15 ENCOUNTER — Other Ambulatory Visit: Payer: Self-pay | Admitting: Family Medicine

## 2020-10-15 DIAGNOSIS — Z1231 Encounter for screening mammogram for malignant neoplasm of breast: Secondary | ICD-10-CM

## 2020-12-20 ENCOUNTER — Ambulatory Visit
Admission: RE | Admit: 2020-12-20 | Discharge: 2020-12-20 | Disposition: A | Discharge status: Home / Self Care | Payer: Medicare Other | Source: Ambulatory Visit | Attending: Family Medicine | Admitting: Family Medicine

## 2020-12-20 ENCOUNTER — Other Ambulatory Visit: Payer: Self-pay

## 2020-12-20 DIAGNOSIS — Z1231 Encounter for screening mammogram for malignant neoplasm of breast: Secondary | ICD-10-CM | POA: Insufficient documentation

## 2020-12-30 ENCOUNTER — Other Ambulatory Visit: Payer: Self-pay | Admitting: Family Medicine

## 2020-12-30 DIAGNOSIS — R928 Other abnormal and inconclusive findings on diagnostic imaging of breast: Secondary | ICD-10-CM

## 2020-12-30 DIAGNOSIS — R2232 Localized swelling, mass and lump, left upper limb: Secondary | ICD-10-CM

## 2021-01-04 ENCOUNTER — Other Ambulatory Visit: Payer: Self-pay

## 2021-01-04 ENCOUNTER — Ambulatory Visit
Admission: RE | Admit: 2021-01-04 | Discharge: 2021-01-04 | Disposition: A | Discharge status: Home / Self Care | Payer: Medicare Other | Source: Ambulatory Visit | Attending: Family Medicine | Admitting: Family Medicine

## 2021-01-04 DIAGNOSIS — R2232 Localized swelling, mass and lump, left upper limb: Secondary | ICD-10-CM | POA: Insufficient documentation

## 2021-01-04 DIAGNOSIS — R928 Other abnormal and inconclusive findings on diagnostic imaging of breast: Secondary | ICD-10-CM | POA: Insufficient documentation

## 2021-01-06 ENCOUNTER — Other Ambulatory Visit: Payer: Self-pay | Admitting: Family Medicine

## 2021-01-06 DIAGNOSIS — R928 Other abnormal and inconclusive findings on diagnostic imaging of breast: Secondary | ICD-10-CM

## 2021-01-12 ENCOUNTER — Ambulatory Visit
Admission: RE | Admit: 2021-01-12 | Discharge: 2021-01-12 | Disposition: A | Discharge status: Home / Self Care | Payer: Medicare Other | Source: Ambulatory Visit | Attending: Family Medicine | Admitting: Family Medicine

## 2021-01-12 ENCOUNTER — Other Ambulatory Visit: Payer: Self-pay

## 2021-01-12 DIAGNOSIS — R928 Other abnormal and inconclusive findings on diagnostic imaging of breast: Secondary | ICD-10-CM | POA: Diagnosis not present

## 2021-01-12 HISTORY — PX: BREAST BIOPSY: SHX20

## 2021-01-21 ENCOUNTER — Encounter: Payer: Self-pay | Admitting: Diagnostic Radiology

## 2021-01-21 LAB — SURGICAL PATHOLOGY

## 2022-01-10 ENCOUNTER — Other Ambulatory Visit: Payer: Self-pay | Admitting: Family Medicine

## 2022-01-10 DIAGNOSIS — N63 Unspecified lump in unspecified breast: Secondary | ICD-10-CM

## 2022-01-30 ENCOUNTER — Inpatient Hospital Stay: Admission: RE | Admit: 2022-01-30 | Payer: Medicare Other | Source: Ambulatory Visit

## 2022-01-30 ENCOUNTER — Other Ambulatory Visit: Payer: Medicare Other

## 2022-02-20 ENCOUNTER — Other Ambulatory Visit: Payer: Medicare Other

## 2022-03-15 ENCOUNTER — Other Ambulatory Visit: Payer: Medicare Other

## 2022-05-24 ENCOUNTER — Ambulatory Visit
Admission: RE | Admit: 2022-05-24 | Discharge: 2022-05-24 | Disposition: A | Discharge status: Home / Self Care | Payer: Medicare Other | Source: Ambulatory Visit | Attending: Family Medicine | Admitting: Family Medicine

## 2022-05-24 DIAGNOSIS — R92323 Mammographic fibroglandular density, bilateral breasts: Secondary | ICD-10-CM | POA: Diagnosis not present

## 2022-05-24 DIAGNOSIS — N63 Unspecified lump in unspecified breast: Secondary | ICD-10-CM | POA: Diagnosis present

## 2022-05-24 DIAGNOSIS — Z1239 Encounter for other screening for malignant neoplasm of breast: Secondary | ICD-10-CM | POA: Insufficient documentation

## 2022-08-09 ENCOUNTER — Ambulatory Visit: Payer: Medicare Other | Admitting: Dermatology

## 2023-05-08 ENCOUNTER — Other Ambulatory Visit: Payer: Self-pay | Admitting: Family Medicine

## 2023-05-08 DIAGNOSIS — Z1231 Encounter for screening mammogram for malignant neoplasm of breast: Secondary | ICD-10-CM

## 2023-06-04 ENCOUNTER — Ambulatory Visit
Admission: RE | Admit: 2023-06-04 | Discharge: 2023-06-04 | Disposition: A | Discharge status: Home / Self Care | Payer: Medicare Other | Source: Ambulatory Visit | Attending: Family Medicine | Admitting: Family Medicine

## 2023-06-04 DIAGNOSIS — Z1231 Encounter for screening mammogram for malignant neoplasm of breast: Secondary | ICD-10-CM | POA: Insufficient documentation
